# Patient Record
Sex: Male | Born: 1990 | Marital: Single
Health system: Southern US, Community
[De-identification: ages and names within clinical notes are randomized; demographics above are authoritative.]

---

## 2017-07-12 ENCOUNTER — Ambulatory Visit: Payer: Self-pay | Admitting: Family Medicine

## 2019-03-02 ENCOUNTER — Emergency Department (HOSPITAL_COMMUNITY)
Admission: EM | Admit: 2019-03-02 | Discharge: 2019-03-02 | Disposition: A | Discharge status: Home / Self Care | Payer: PRIVATE HEALTH INSURANCE | Attending: Emergency Medicine | Admitting: Emergency Medicine

## 2019-03-02 ENCOUNTER — Emergency Department (HOSPITAL_COMMUNITY): Payer: PRIVATE HEALTH INSURANCE

## 2019-03-02 ENCOUNTER — Other Ambulatory Visit: Payer: Self-pay

## 2019-03-02 DIAGNOSIS — Y929 Unspecified place or not applicable: Secondary | ICD-10-CM | POA: Diagnosis not present

## 2019-03-02 DIAGNOSIS — Y9301 Activity, walking, marching and hiking: Secondary | ICD-10-CM | POA: Insufficient documentation

## 2019-03-02 DIAGNOSIS — X500XXA Overexertion from strenuous movement or load, initial encounter: Secondary | ICD-10-CM | POA: Insufficient documentation

## 2019-03-02 DIAGNOSIS — S99921A Unspecified injury of right foot, initial encounter: Secondary | ICD-10-CM | POA: Diagnosis present

## 2019-03-02 DIAGNOSIS — Y999 Unspecified external cause status: Secondary | ICD-10-CM | POA: Diagnosis not present

## 2019-03-02 DIAGNOSIS — S93601A Unspecified sprain of right foot, initial encounter: Secondary | ICD-10-CM | POA: Insufficient documentation

## 2019-03-02 NOTE — ED Provider Notes (Signed)
[facility] EMERGENCY DEPARTMENT Provider Note   CSN: [id number] Arrival date & time: 03/02/19  2158     History   Chief Complaint Chief Complaint  Patient presents with  . Foot Injury    HPI Alexander Luna is a 28 y.o. male.     28 year old male who presents with right foot injury.  Just prior to arrival, the patient was walking when he stepped off a curb and twisted his right ankle/foot.  He had an immediate onset of pain and difficulty bearing weight on his foot.  He denies any other injuries.  The history is provided by the patient.  Foot Injury   No past medical history on file.  There are no active problems to display for this patient.   *The histories are not reviewed yet. Please review them in the "History" navigator section and refresh this Ferguson.      Home Medications    Prior to Admission medications   Not on File    Family History No family history on file.  Social History Social History   Tobacco Use  . Smoking status: Not on file  Substance Use Topics  . Alcohol use: Not on file  . Drug use: Not on file     Allergies   Patient has no known allergies.   Review of Systems Review of Systems  Musculoskeletal: Positive for gait problem and joint swelling.  Skin: Negative for wound.  Neurological: Negative for numbness.     Physical Exam Updated Vital Signs BP 125/65 (BP Location: Left Arm)   Pulse 70   Temp 98.1 F (36.7 C) (Oral)   Resp 16   SpO2 100%   Physical Exam Vitals signs and nursing note reviewed.  Constitutional:      General: He is not in acute distress.    Appearance: He is well-developed.  HENT:     Head: Normocephalic and atraumatic.  Cardiovascular:     Pulses: Normal pulses.  Musculoskeletal:        General: Swelling and tenderness present.       Feet:     Comments: RLE: no proximal fibular tenderness, no base of 5th metatarsal tenderness, no midfoot instability; no  medial/lateral malleolus tenderness or swelling; tenderness of proximal, lateral side of dorsal foot w/ associated swelling  Skin:    General: Skin is warm and dry.     Findings: No erythema.  Neurological:     Mental Status: He is alert and oriented to person, place, and time.     Sensory: No sensory deficit.     Comments: Normal sensation b/l lower extremities  Psychiatric:        Judgment: Judgment normal.      ED Treatments / Results  Labs (all labs ordered are listed, but only abnormal results are displayed) Labs Reviewed - No data to display  EKG None  Radiology Dg Ankle Complete Right  Result Date: 03/02/2019 CLINICAL DATA:  Slipped and fell EXAM: RIGHT ANKLE - COMPLETE 3+ VIEW COMPARISON:  None. FINDINGS: There is no evidence of fracture, dislocation, or joint effusion. There is no evidence of arthropathy or other focal bone abnormality. Soft tissues are unremarkable. IMPRESSION: Negative. Electronically Signed   By: Donavan Foil M.D.   On: 03/02/2019 22:35   Dg Foot Complete Right  Result Date: 03/02/2019 CLINICAL DATA:  Slip and fell EXAM: RIGHT FOOT COMPLETE - 3+ VIEW COMPARISON:  None. FINDINGS: There is no evidence of fracture or dislocation. There is  no evidence of arthropathy or other focal bone abnormality. Soft tissues are unremarkable. IMPRESSION: Negative. Electronically Signed   By: Jasmine Pang M.D.   On: 03/02/2019 22:36    Procedures Procedures (including critical care time)  Medications Ordered in ED Medications - No data to display   Initial Impression / Assessment and Plan / ED Course  I have reviewed the triage vital signs and the nursing notes.  Pertinent imaging results that were available during my care of the patient were reviewed by me and considered in my medical decision making (see chart for details).       Suspect foot sprain based on exam and hx. XR ankle and foot negative for fracture. No midfoot instability to suggest Lisfranc  injury. Gave ASO brace and crutches and discussed supportive measures, ortho f/u as needed.  Final Clinical Impressions(s) / ED Diagnoses   Final diagnoses:  Sprain of right foot, initial encounter    ED Discharge Orders    None       Dalaney Needle, Ambrose Finland, MD 03/02/19 2327

## 2019-03-02 NOTE — ED Notes (Signed)
Pt ambulatory on crutches. Verbalized dc teaching

## 2019-03-02 NOTE — ED Triage Notes (Signed)
Onset tonight pt stepped off curb, twisted right foot.  Unable to put full weight on right foot.

## 2021-04-15 IMAGING — DX DG ANKLE COMPLETE 3+V*R*
3 series · 3 of 3 positions shown · non-contrast
Comparison: None.

CLINICAL DATA: Slipped and fell

EXAM:
RIGHT ANKLE - COMPLETE 3+ VIEW

[ankle ap]
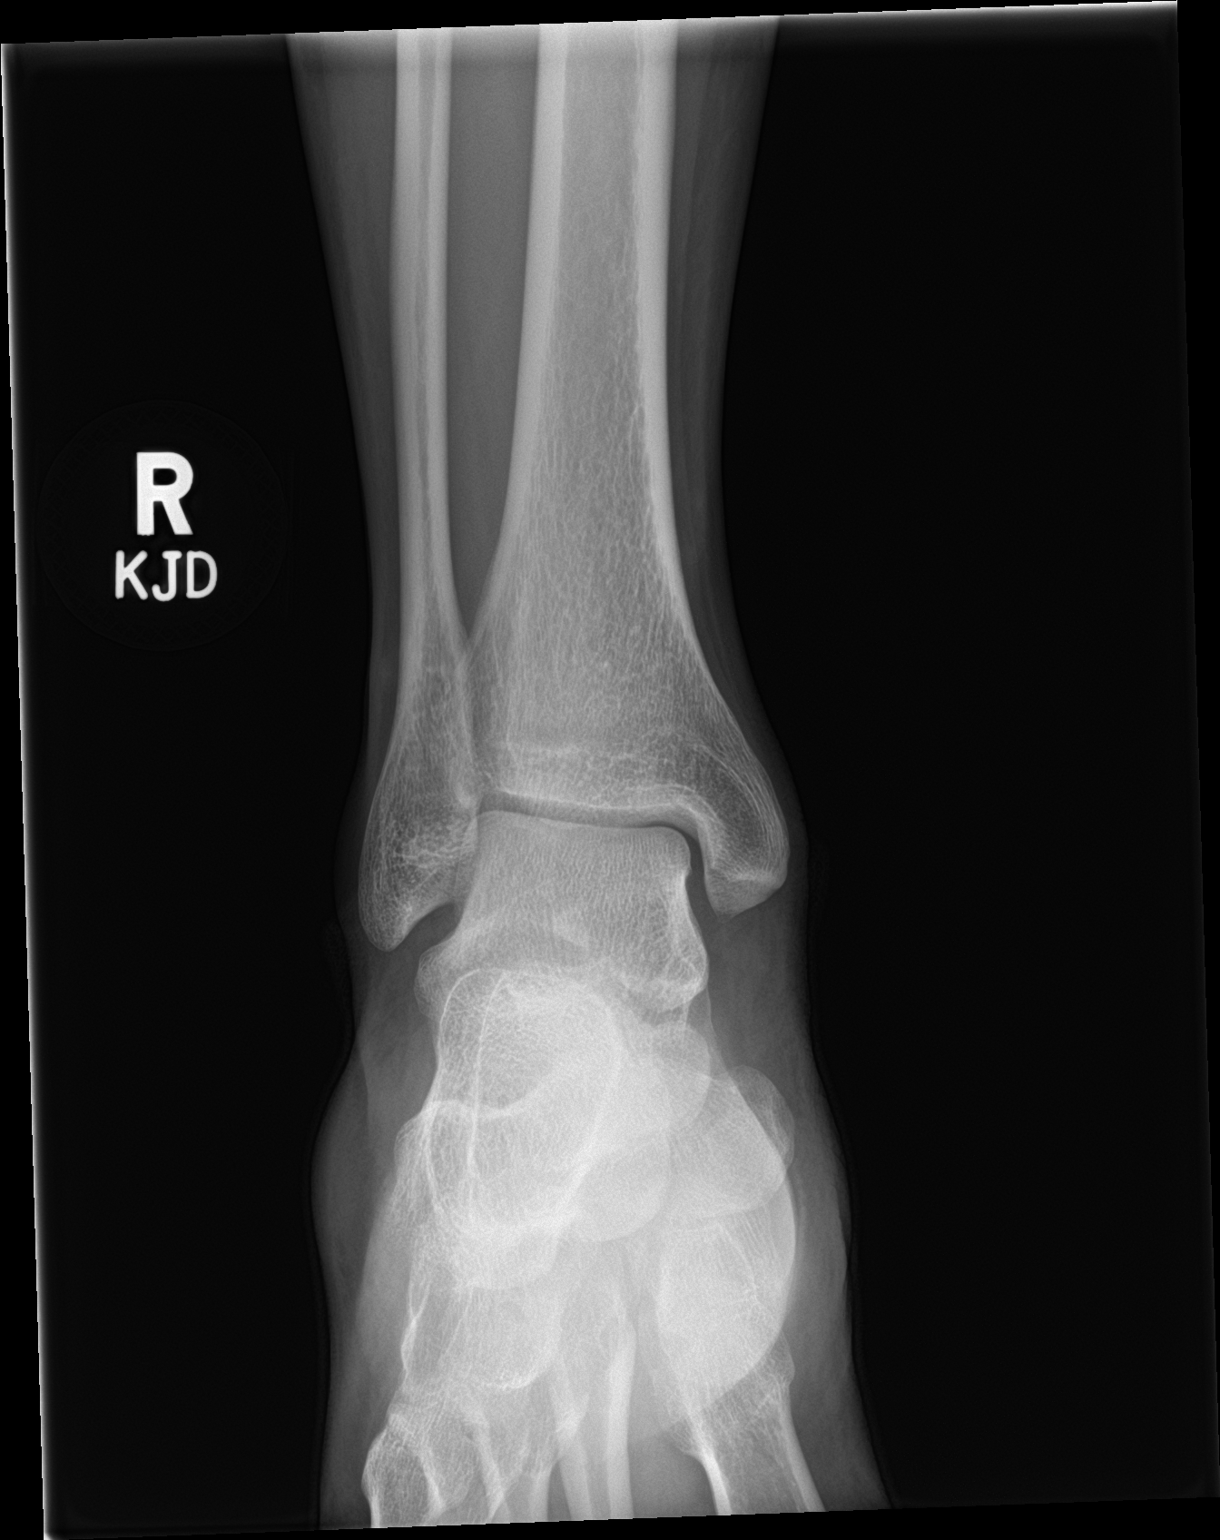

[ankle obl]
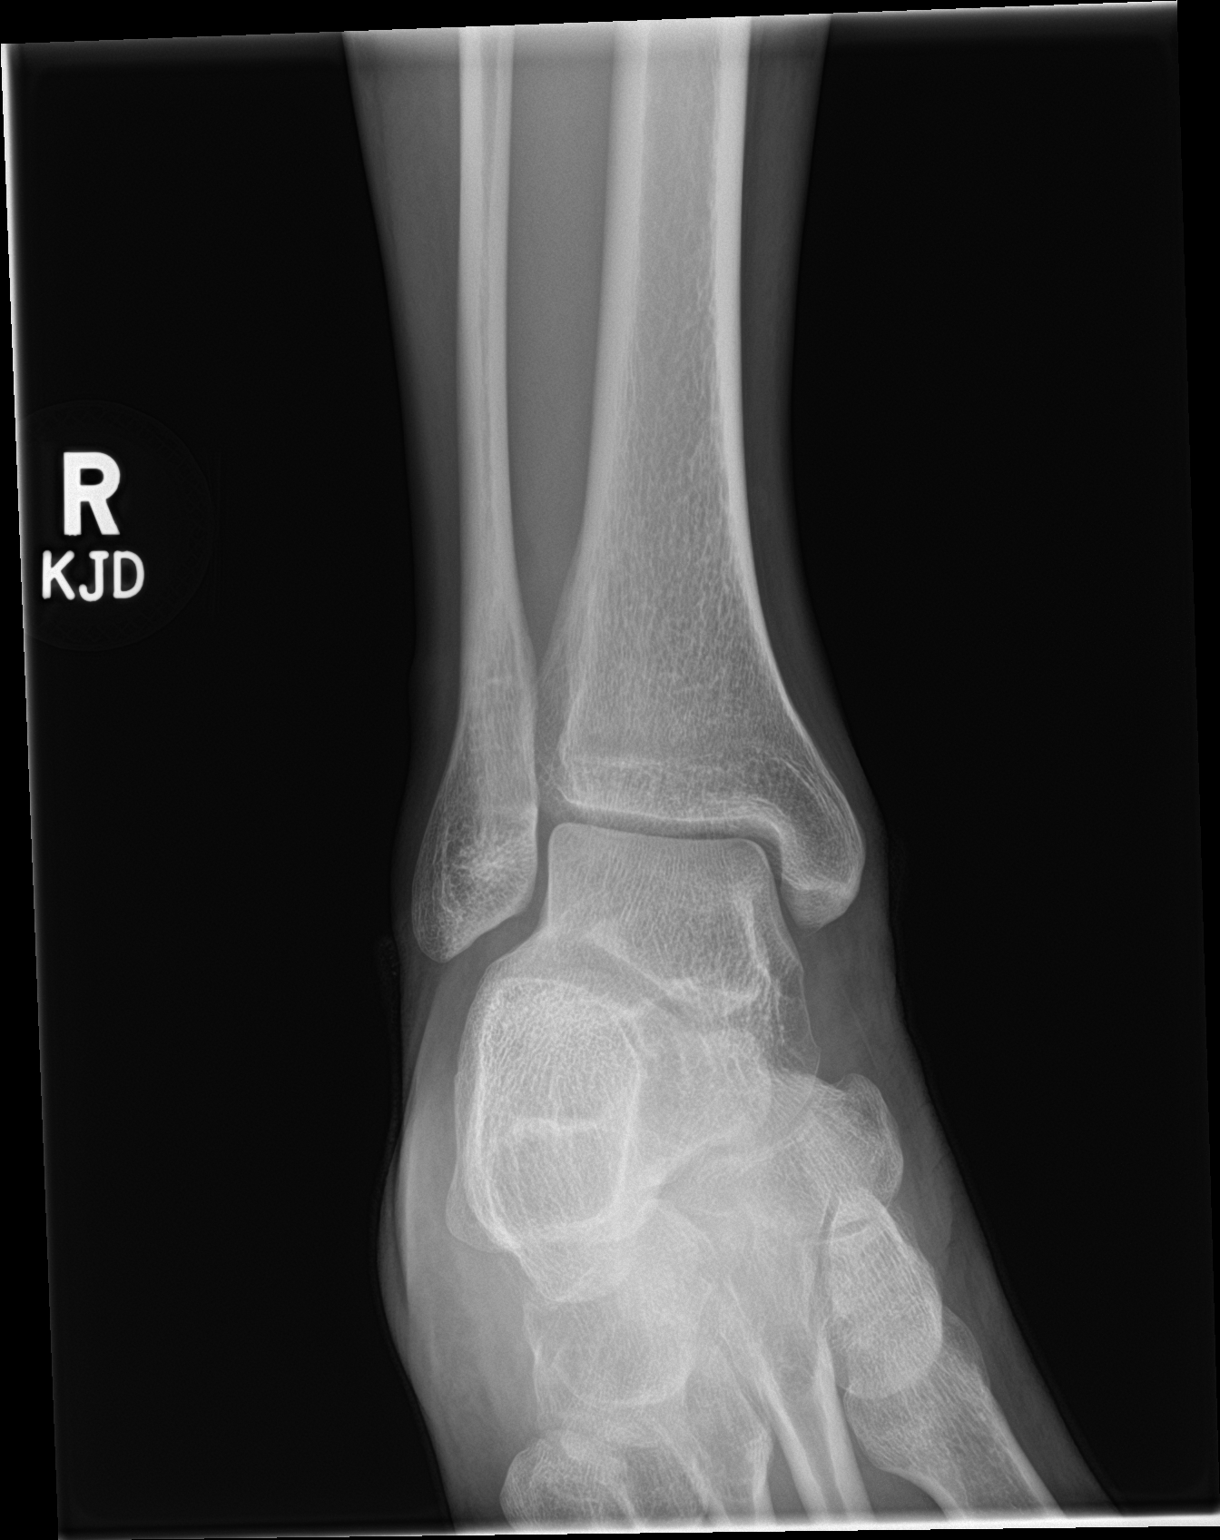

[ankle lat]
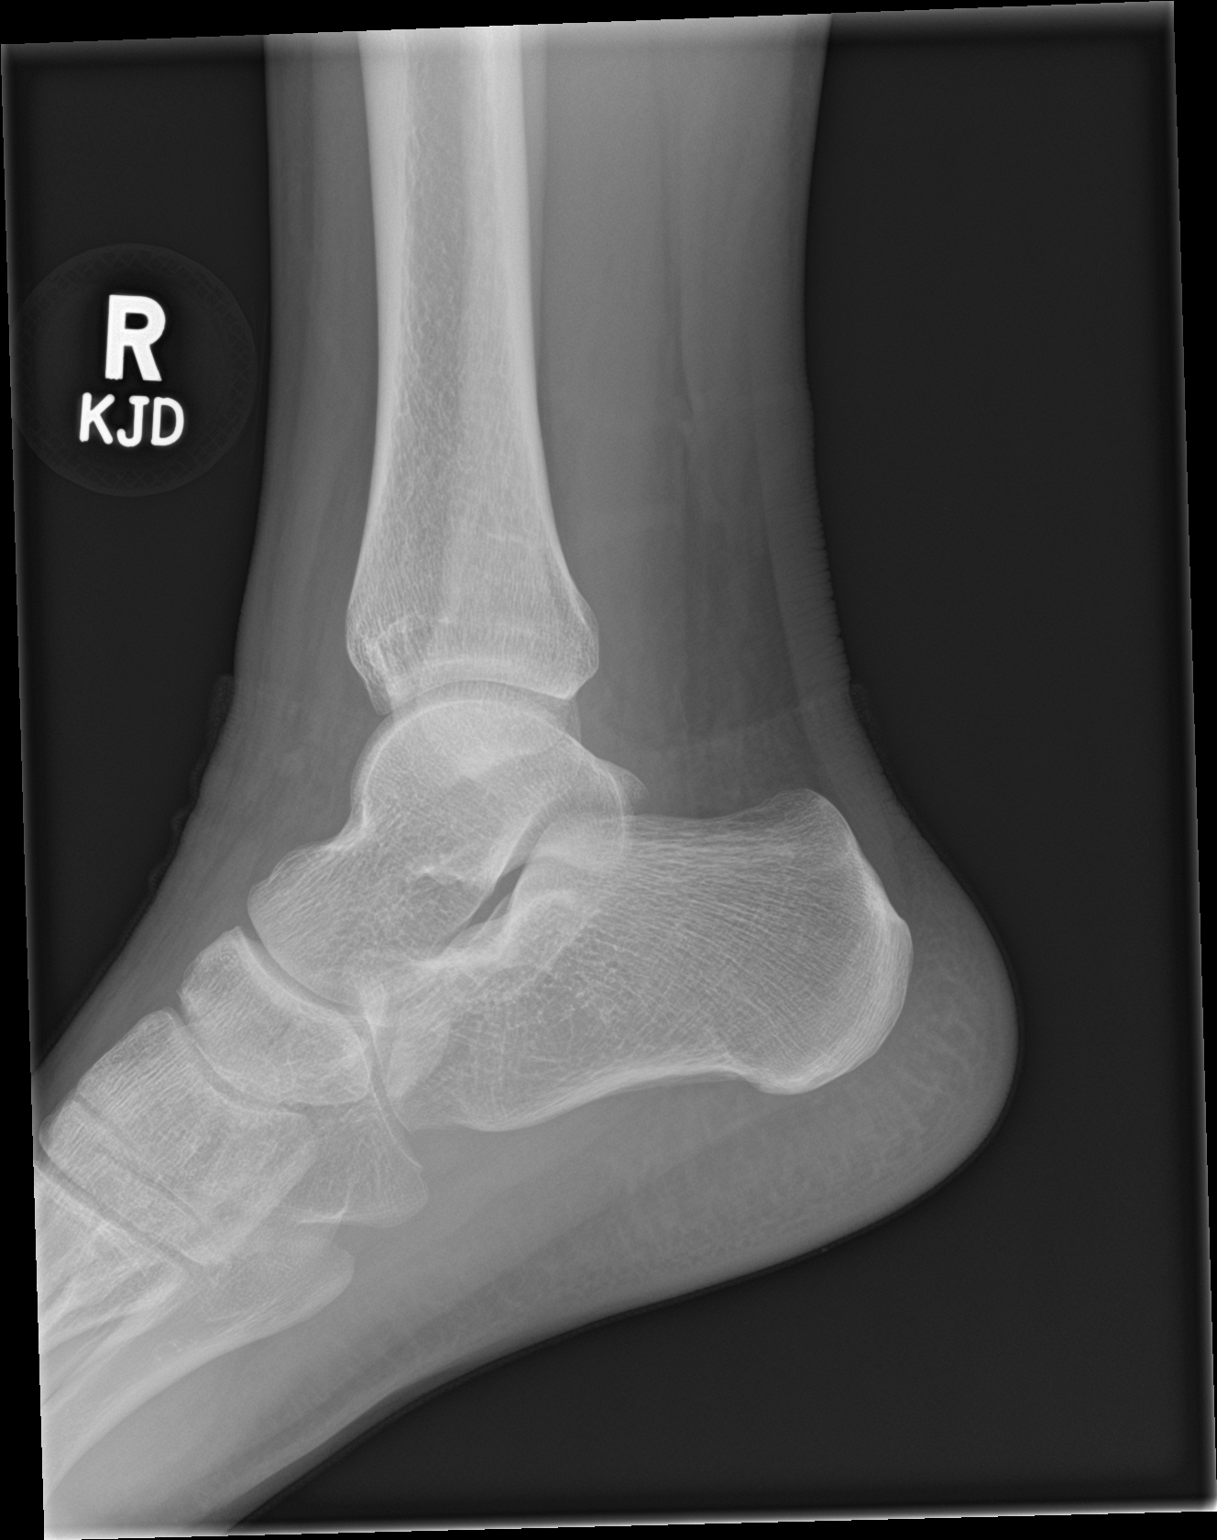

[3 of 3 positions shown; findings below may reference images not displayed]

FINDINGS: There is no evidence of fracture, dislocation, or joint effusion.
There is no evidence of arthropathy or other focal bone abnormality.
Soft tissues are unremarkable.
IMPRESSION: Negative.

## 2023-11-17 ENCOUNTER — Ambulatory Visit: Payer: Self-pay | Admitting: Family Medicine
